# Patient Record
Sex: Male | Born: 1960 | Race: White | Hispanic: No | Smoking: Current every day smoker
Health system: Southern US, Community
[De-identification: ages and names within clinical notes are randomized; demographics above are authoritative.]

## PROBLEM LIST (undated history)

## (undated) DIAGNOSIS — K625 Hemorrhage of anus and rectum: Secondary | ICD-10-CM

## (undated) HISTORY — PX: TONSILLECTOMY: SUR1361

---

## 2014-09-28 DEATH — deceased

## 2015-01-05 ENCOUNTER — Encounter
Admission: RE | Disposition: A | Discharge status: Home / Self Care | Payer: Self-pay | Source: Ambulatory Visit | Attending: Gastroenterology

## 2015-01-05 ENCOUNTER — Ambulatory Visit: Payer: No Typology Code available for payment source | Admitting: Anesthesiology

## 2015-01-05 ENCOUNTER — Ambulatory Visit
Admission: RE | Admit: 2015-01-05 | Discharge: 2015-01-05 | Disposition: A | Discharge status: Home / Self Care | Payer: No Typology Code available for payment source | Source: Ambulatory Visit | Attending: Gastroenterology | Admitting: Gastroenterology

## 2015-01-05 ENCOUNTER — Encounter: Payer: Self-pay | Admitting: *Deleted

## 2015-01-05 DIAGNOSIS — K219 Gastro-esophageal reflux disease without esophagitis: Secondary | ICD-10-CM | POA: Diagnosis not present

## 2015-01-05 DIAGNOSIS — K449 Diaphragmatic hernia without obstruction or gangrene: Secondary | ICD-10-CM | POA: Diagnosis not present

## 2015-01-05 DIAGNOSIS — K640 First degree hemorrhoids: Secondary | ICD-10-CM | POA: Diagnosis not present

## 2015-01-05 DIAGNOSIS — K5730 Diverticulosis of large intestine without perforation or abscess without bleeding: Secondary | ICD-10-CM | POA: Insufficient documentation

## 2015-01-05 DIAGNOSIS — D122 Benign neoplasm of ascending colon: Secondary | ICD-10-CM | POA: Diagnosis not present

## 2015-01-05 DIAGNOSIS — K2270 Barrett's esophagus without dysplasia: Secondary | ICD-10-CM | POA: Diagnosis not present

## 2015-01-05 DIAGNOSIS — Z79899 Other long term (current) drug therapy: Secondary | ICD-10-CM | POA: Diagnosis not present

## 2015-01-05 DIAGNOSIS — K921 Melena: Secondary | ICD-10-CM | POA: Diagnosis present

## 2015-01-05 HISTORY — DX: Hemorrhage of anus and rectum: K62.5

## 2015-01-05 HISTORY — PX: ESOPHAGOGASTRODUODENOSCOPY: SHX5428

## 2015-01-05 HISTORY — PX: COLONOSCOPY: SHX5424

## 2015-01-05 SURGERY — COLONOSCOPY
Anesthesia: General

## 2015-01-05 MED ORDER — FENTANYL CITRATE (PF) 100 MCG/2ML IJ SOLN
INTRAMUSCULAR | Status: DC | PRN
  Administered 2015-01-05 (×3): 50 ug via INTRAVENOUS

## 2015-01-05 MED ORDER — PROPOFOL INFUSION 10 MG/ML OPTIME
INTRAVENOUS | Status: DC | PRN
  Administered 2015-01-05: 100 ug/kg/min via INTRAVENOUS

## 2015-01-05 MED ORDER — SODIUM CHLORIDE 0.9 % IV SOLN
INTRAVENOUS | Status: DC
  Administered 2015-01-05: 12:00:00 via INTRAVENOUS
  Administered 2015-01-05: 1000 mL via INTRAVENOUS

## 2015-01-05 MED ORDER — SODIUM CHLORIDE 0.9 % IV SOLN: INTRAVENOUS | Status: DC

## 2015-01-05 MED ORDER — PROPOFOL 10 MG/ML IV BOLUS
INTRAVENOUS | Status: DC | PRN
  Administered 2015-01-05: 30 mg via INTRAVENOUS

## 2015-01-05 MED ORDER — MIDAZOLAM HCL 2 MG/2ML IJ SOLN
INTRAMUSCULAR | Status: DC | PRN
  Administered 2015-01-05: 1 mg via INTRAVENOUS

## 2015-01-05 NOTE — Anesthesia Preprocedure Evaluation (Signed)
Anesthesia Evaluation  Patient identified by MRN, date of birth, ID band Patient awake    Reviewed: Allergy & Precautions, H&P , NPO status , Patient's Chart, lab work & pertinent test results, reviewed documented beta blocker date and time   Airway Mallampati: III  TM Distance: >3 FB Neck ROM: full    Dental no notable dental hx.    Pulmonary neg pulmonary ROS,  breath sounds clear to auscultation  Pulmonary exam normal       Cardiovascular Exercise Tolerance: Good negative cardio ROS  Rhythm:regular Rate:Normal     Neuro/Psych negative neurological ROS  negative psych ROS   GI/Hepatic negative GI ROS, Neg liver ROS,   Endo/Other  negative endocrine ROS  Renal/GU negative Renal ROS  negative genitourinary   Musculoskeletal   Abdominal   Peds  Hematology negative hematology ROS (+)   Anesthesia Other Findings   Reproductive/Obstetrics negative OB ROS                             Anesthesia Physical Anesthesia Plan  ASA: II  Anesthesia Plan: General   Post-op Pain Management:    Induction:   Airway Management Planned:   Additional Equipment:   Intra-op Plan:   Post-operative Plan:   Informed Consent: I have reviewed the patients History and Physical, chart, labs and discussed the procedure including the risks, benefits and alternatives for the proposed anesthesia with the patient or authorized representative who has indicated his/her understanding and acceptance.   Dental Advisory Given  Plan Discussed with: CRNA  Anesthesia Plan Comments:         Anesthesia Quick Evaluation

## 2015-01-05 NOTE — Transfer of Care (Signed)
Immediate Anesthesia Transfer of Care Note  Patient: Bob Brooks  Procedure(s) Performed: Procedure(s): COLONOSCOPY (N/A) ESOPHAGOGASTRODUODENOSCOPY (EGD) (N/A)  Patient Location: PACU  Anesthesia Type:General  Level of Consciousness: awake, alert  and oriented  Airway & Oxygen Therapy: Patient Spontanous Breathing  Post-op Assessment: Report given to RN  Post vital signs: Reviewed and stable  Last Vitals:  Filed Vitals:   01/05/15 1029  BP: 152/79  Pulse: 88  Temp: 37.1 C  Resp: 20    Complications: No apparent anesthesia complications

## 2015-01-05 NOTE — Op Note (Signed)
[facility] Patient Name: Bob Brooks Procedure Date: 01/05/2015 12:01 PM MRN: [id number] Account #: [id number] Date of Birth: [birth year] Admit Type: Outpatient Age: 54 Room: [facility] ENDO ROOM 4 Gender: Male Note Status: Finalized Procedure:         Colonoscopy Indications:       Hematochezia Providers:         Lucilla Lame, MD Referring MD:      Lucilla Lame, MD (Referring MD) Medicines:         Propofol per Anesthesia Complications:     No immediate complications. Procedure:         Pre-Anesthesia Assessment:                    - Prior to the procedure, a History and Physical was                     performed, and patient medications and allergies were                     reviewed. The patient's tolerance of previous anesthesia                     was also reviewed. The risks and benefits of the procedure                     and the sedation options and risks were discussed with the                     patient. All questions were answered, and informed consent                     was obtained. Prior Anticoagulants: The patient has taken                     no previous anticoagulant or antiplatelet agents. ASA                     Grade Assessment: II - A patient with mild systemic                     disease. After reviewing the risks and benefits, the                     patient was deemed in satisfactory condition to undergo                     the procedure.                    After obtaining informed consent, the colonoscope was                     passed under direct vision. Throughout the procedure, the                     patient's blood pressure, pulse, and oxygen saturations                     were monitored continuously. The Olympus CF-Q160AL                     colonoscope (S#. [id number]) was introduced through the anus                     and  advanced to the the cecum, identified by appendiceal                     orifice and ileocecal  valve. The colonoscopy was performed                     without difficulty. The patient tolerated the procedure                     well. The quality of the bowel preparation was good. Findings:      The perianal and digital rectal examinations were normal.      A 6 mm polyp was found in the ascending colon. The polyp was sessile.       The polyp was removed with a cold snare. Resection and retrieval were       complete.      A few small-mouthed diverticula were found in the entire colon.      Non-bleeding internal hemorrhoids were found during retroflexion. The       hemorrhoids were Grade I (internal hemorrhoids that do not prolapse). Impression:        - One 6 mm polyp in the ascending colon. Resected and                     retrieved.                    - Diverticulosis in the entire examined colon.                    - Non-bleeding internal hemorrhoids. Recommendation:    - Await pathology results.                    - Repeat colonoscopy in 5 years if polyp adenoma and 10                     years if hyperplastic Procedure Code(s): --- Professional ---                    [phone], Colonoscopy, flexible; with removal of tumor(s),                     polyp(s), or other lesion(s) by snare technique Diagnosis Code(s): --- Professional ---                    K92.1, Melena                    D12.2, Benign neoplasm of ascending colon CPT copyright 2014 American Medical Association. All rights reserved. The codes documented in this report are preliminary and upon coder review may  be revised to meet current compliance requirements. Lucilla Lame, MD 01/05/2015 12:15:54 PM This report has been signed electronically. Number of Addenda: 0 Note Initiated On: 01/05/2015 12:01 PM Scope Withdrawal Time: 0 hours 7 minutes 13 seconds  Total Procedure Duration: 0 hours 9 minutes 43 seconds       [facility]

## 2015-01-05 NOTE — Op Note (Signed)
[facility] Gastroenterology Patient Name: Bob Brooks Procedure Date: 01/05/2015 11:48 AM MRN: [id number] Account #: [id number] Date of Birth: October 23, 1960 Admit Type: Outpatient Age: 54 Room: [facility] ENDO ROOM 4 Gender: Male Note Status: Finalized Procedure:         Upper GI endoscopy Indications:       Heartburn Providers:         Lucilla Lame, MD Referring MD:      Lucilla Lame, MD (Referring MD) Medicines:         Propofol per Anesthesia Complications:     No immediate complications. Procedure:         Pre-Anesthesia Assessment:                    - Prior to the procedure, a History and Physical was                     performed, and patient medications and allergies were                     reviewed. The patient's tolerance of previous anesthesia                     was also reviewed. The risks and benefits of the procedure                     and the sedation options and risks were discussed with the                     patient. All questions were answered, and informed consent                     was obtained. Prior Anticoagulants: The patient has taken                     no previous anticoagulant or antiplatelet agents. ASA                     Grade Assessment: II - A patient with mild systemic                     disease. After reviewing the risks and benefits, the                     patient was deemed in satisfactory condition to undergo                     the procedure.                    After obtaining informed consent, the endoscope was passed                     under direct vision. Throughout the procedure, the                     patient's blood pressure, pulse, and oxygen saturations                     were monitored continuously. The Endoscope was introduced                     through the mouth, and advanced to the second part of  duodenum. The upper GI endoscopy was accomplished without                     difficulty. The  patient tolerated the procedure well. Findings:      A medium-sized hiatus hernia was present.      The Z-line was irregular and was found in the lower third of the       esophagus. Biopsies were taken with a cold forceps for histology.      The stomach was normal.      The examined duodenum was normal. Impression:        - Medium-sized hiatus hernia.                    - Z-line irregular, in the lower third of the esophagus.                     Biopsied.                    - Normal stomach.                    - Normal examined duodenum. Recommendation:    - Await pathology results.                    - Perform a colonoscopy today. Procedure Code(s): --- Professional ---                    [phone], Esophagogastroduodenoscopy, flexible, transoral;                     with biopsy, single or multiple Diagnosis Code(s): --- Professional ---                    R12, Heartburn                    K44.9, Diaphragmatic hernia without obstruction or gangrene                    K22.8, Other specified diseases of esophagus CPT copyright 2014 American Medical Association. All rights reserved. The codes documented in this report are preliminary and upon coder review may  be revised to meet current compliance requirements. Lucilla Lame, MD 01/05/2015 12:01:22 PM This report has been signed electronically. Number of Addenda: 0 Note Initiated On: 01/05/2015 11:48 AM      [facility]

## 2015-01-05 NOTE — Anesthesia Postprocedure Evaluation (Signed)
  Anesthesia Post-op Note  Patient: Bob Brooks  Procedure(s) Performed: Procedure(s): COLONOSCOPY (N/A) ESOPHAGOGASTRODUODENOSCOPY (EGD) (N/A)  Anesthesia type:General  Patient location: PACU  Post pain: Pain level controlled  Post assessment: Post-op Vital signs reviewed, Patient's Cardiovascular Status Stable, Respiratory Function Stable, Patent Airway and No signs of Nausea or vomiting  Post vital signs: Reviewed and stable  Last Vitals:  Filed Vitals:   01/05/15 1240  BP: 173/99  Pulse:   Temp:   Resp: 18    Level of consciousness: awake, alert  and patient cooperative  Complications: No apparent anesthesia complications

## 2015-01-05 NOTE — H&P (Signed)
  [facility]  [address], [place], [place] [address] Phone: [phone] Fax : [phone]  Primary Care Physician:  No PCP Per Patient Primary Gastroenterologist:  Dr. Allen Norris  Pre-Procedure History & Physical: HPI:  Bob Brooks is a 54 y.o. male is here for an endoscopy and colonoscopy.   Past Medical History  Diagnosis Date  . Rectal bleeding     Past Surgical History  Procedure Laterality Date  . Tonsillectomy      Prior to Admission medications   Medication Sig Start Date End Date Taking? Authorizing Provider  omeprazole (PRILOSEC) 20 MG capsule Take 1 capsule by mouth daily. 12/11/14  Yes Historical Provider, MD  [place] Take 1 Container by mouth once. 11/12/14  Yes Historical Provider, MD    Allergies as of 12/17/2014  . (Not on File)    No family history on file.  History   Social History  . Marital Status: Divorced    Spouse Name: N/A  . Number of Children: N/A  . Years of Education: N/A   Occupational History  . Not on file.   Social History Main Topics  . Smoking status: Not on file  . Smokeless tobacco: Not on file  . Alcohol Use: Not on file  . Drug Use: Not on file  . Sexual Activity: Not on file   Other Topics Concern  . Not on file   Social History Narrative  . No narrative on file    Review of Systems: See HPI, otherwise negative ROS  Physical Exam: BP 152/79 mmHg  Pulse 88  Temp(Src) 98.7 F (37.1 C) (Oral)  Resp 20  Ht 6' (1.829 m)  Wt 210 lb (95.255 kg)  BMI 28.47 kg/m2  SpO2 100% General:   Alert,  pleasant and cooperative in NAD Head:  Normocephalic and atraumatic. Neck:  Supple; no masses or thyromegaly. Lungs:  Clear throughout to auscultation.    Heart:  Regular rate and rhythm. Abdomen:  Soft, nontender and nondistended. Normal bowel sounds, without guarding, and without rebound.   Neurologic:  Alert and  oriented x4;  grossly normal neurologically.  Impression/Plan: [name] is here for an endoscopy and colonoscopy to be performed for hematochezia and GERD  Risks, benefits, limitations, and alternatives regarding  endoscopy and colonoscopy have been reviewed with the patient.  Questions have been answered.  All parties agreeable.   [facility], MD  01/05/2015, 10:54 AM

## 2015-01-06 ENCOUNTER — Encounter: Payer: Self-pay | Admitting: Gastroenterology

## 2015-01-06 LAB — SURGICAL PATHOLOGY

## 2016-07-24 ENCOUNTER — Ambulatory Visit: Payer: No Typology Code available for payment source | Admitting: Podiatry

## 2016-09-01 ENCOUNTER — Ambulatory Visit (INDEPENDENT_AMBULATORY_CARE_PROVIDER_SITE_OTHER): Payer: No Typology Code available for payment source

## 2016-09-01 ENCOUNTER — Ambulatory Visit (INDEPENDENT_AMBULATORY_CARE_PROVIDER_SITE_OTHER): Payer: No Typology Code available for payment source | Admitting: Podiatry

## 2016-09-01 ENCOUNTER — Encounter: Payer: Self-pay | Admitting: Podiatry

## 2016-09-01 VITALS — Temp 98.5°F | Resp 16

## 2016-09-01 DIAGNOSIS — E1143 Type 2 diabetes mellitus with diabetic autonomic (poly)neuropathy: Secondary | ICD-10-CM | POA: Diagnosis not present

## 2016-09-01 DIAGNOSIS — E1149 Type 2 diabetes mellitus with other diabetic neurological complication: Secondary | ICD-10-CM | POA: Diagnosis not present

## 2016-09-01 DIAGNOSIS — R52 Pain, unspecified: Secondary | ICD-10-CM

## 2016-09-01 MED ORDER — NONFORMULARY OR COMPOUNDED ITEM: 1.0000 g | Freq: Four times a day (QID) | 2 refills | Status: AC

## 2016-09-01 NOTE — Progress Notes (Signed)
   Subjective:    Patient ID: Bob Brooks, male    DOB: 1960/11/14, 56 y.o.   MRN: [id number]  HPI    Review of Systems  All other systems reviewed and are negative.      Objective:   Physical Exam        Assessment & Plan:

## 2016-09-01 NOTE — Progress Notes (Signed)
Patient ID: Bob Brooks, male   DOB: July 05, 1961, 56 y.o.   MRN: [id number] Subjective:  Patient presents today for lower showing numbness and tingling which is been going on for several months now. Patient states that he is a diabetic. Patient states that the incision on the bottom of his feet and toes of his right lower extremity is constant. There are no alleviating factors. Patient works on concrete floors and states that after several hours his feet become incredibly painful. Patient describes the pain as "coldness".  Patient also states that he is just recently been diagnosed with diabetes. Patient had vision symptoms which caused him to go to his primary care physician when he was diagnosed. Patient states that he almost what Patient presents today for further treatment and evaluation    Objective/Physical Exam General: The patient is alert and oriented x3 in no acute distress.  Dermatology: Skin is warm, dry and supple bilateral lower extremities. Negative for open lesions or macerations.  Vascular: Palpable pedal pulses bilaterally. No edema or erythema noted. Capillary refill within normal limits.  Neurological: Epicritic and protective threshold diminished bilaterally.   Musculoskeletal Exam: Range of motion within normal limits to all pedal and ankle joints bilateral. Muscle strength 5/5 in all groups bilateral.    Assessment: #1 diabetes mellitus with manifestations of peripheral neuropathy   Plan of Care:  #1 Patient was evaluated. #2 patient is currently taking gabapentin 100 mg TID prescribed by his primary care physician. Recommend increasing the dose to 300 mg TID.  #3 prescription for peripheral neuropathy pain cream dispensed through [facility] #4 return to clinic when necessary   Edrick Kins, DPM [facility]  Dr. Edrick Kins, [address]                                        [place], [place] [address]                Office  [phone]  Fax [phone]

## 2018-05-14 ENCOUNTER — Ambulatory Visit
Admission: RE | Admit: 2018-05-14 | Discharge: 2018-05-14 | Disposition: A | Discharge status: Home / Self Care | Payer: Self-pay | Source: Ambulatory Visit | Attending: Nurse Practitioner | Admitting: Nurse Practitioner

## 2018-05-14 ENCOUNTER — Other Ambulatory Visit: Payer: Self-pay | Admitting: Nurse Practitioner

## 2018-05-14 DIAGNOSIS — M79642 Pain in left hand: Secondary | ICD-10-CM

## 2018-05-14 DIAGNOSIS — X58XXXA Exposure to other specified factors, initial encounter: Secondary | ICD-10-CM | POA: Insufficient documentation

## 2018-05-14 DIAGNOSIS — Z9181 History of falling: Secondary | ICD-10-CM | POA: Insufficient documentation

## 2018-05-14 DIAGNOSIS — S62112A Displaced fracture of triquetrum [cuneiform] bone, left wrist, initial encounter for closed fracture: Secondary | ICD-10-CM | POA: Insufficient documentation

## 2020-04-17 IMAGING — CR DG HAND COMPLETE 3+V*L*
3 series · 3 of 3 positions shown · non-contrast
Comparison: None.

CLINICAL DATA: Left hand pain.  Fall.

EXAM:
LEFT HAND - COMPLETE 3+ VIEW

[hand ap]
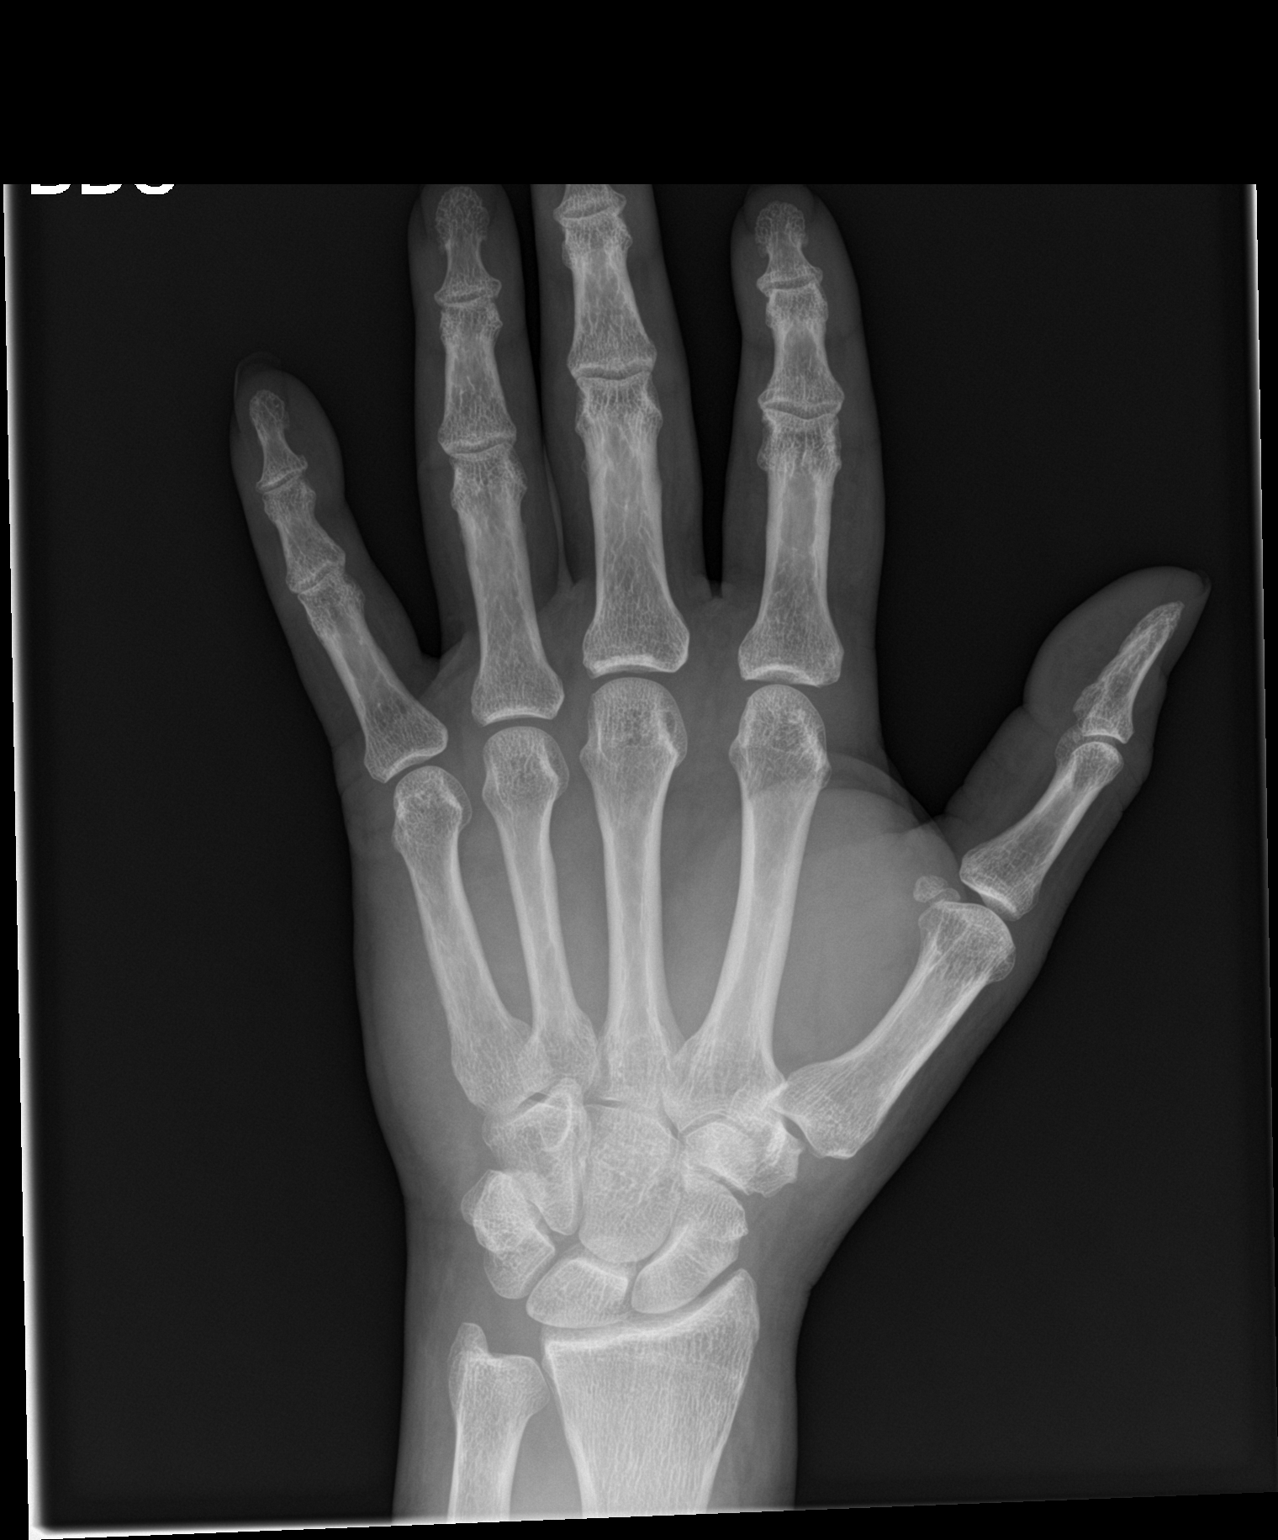

[hand obl]
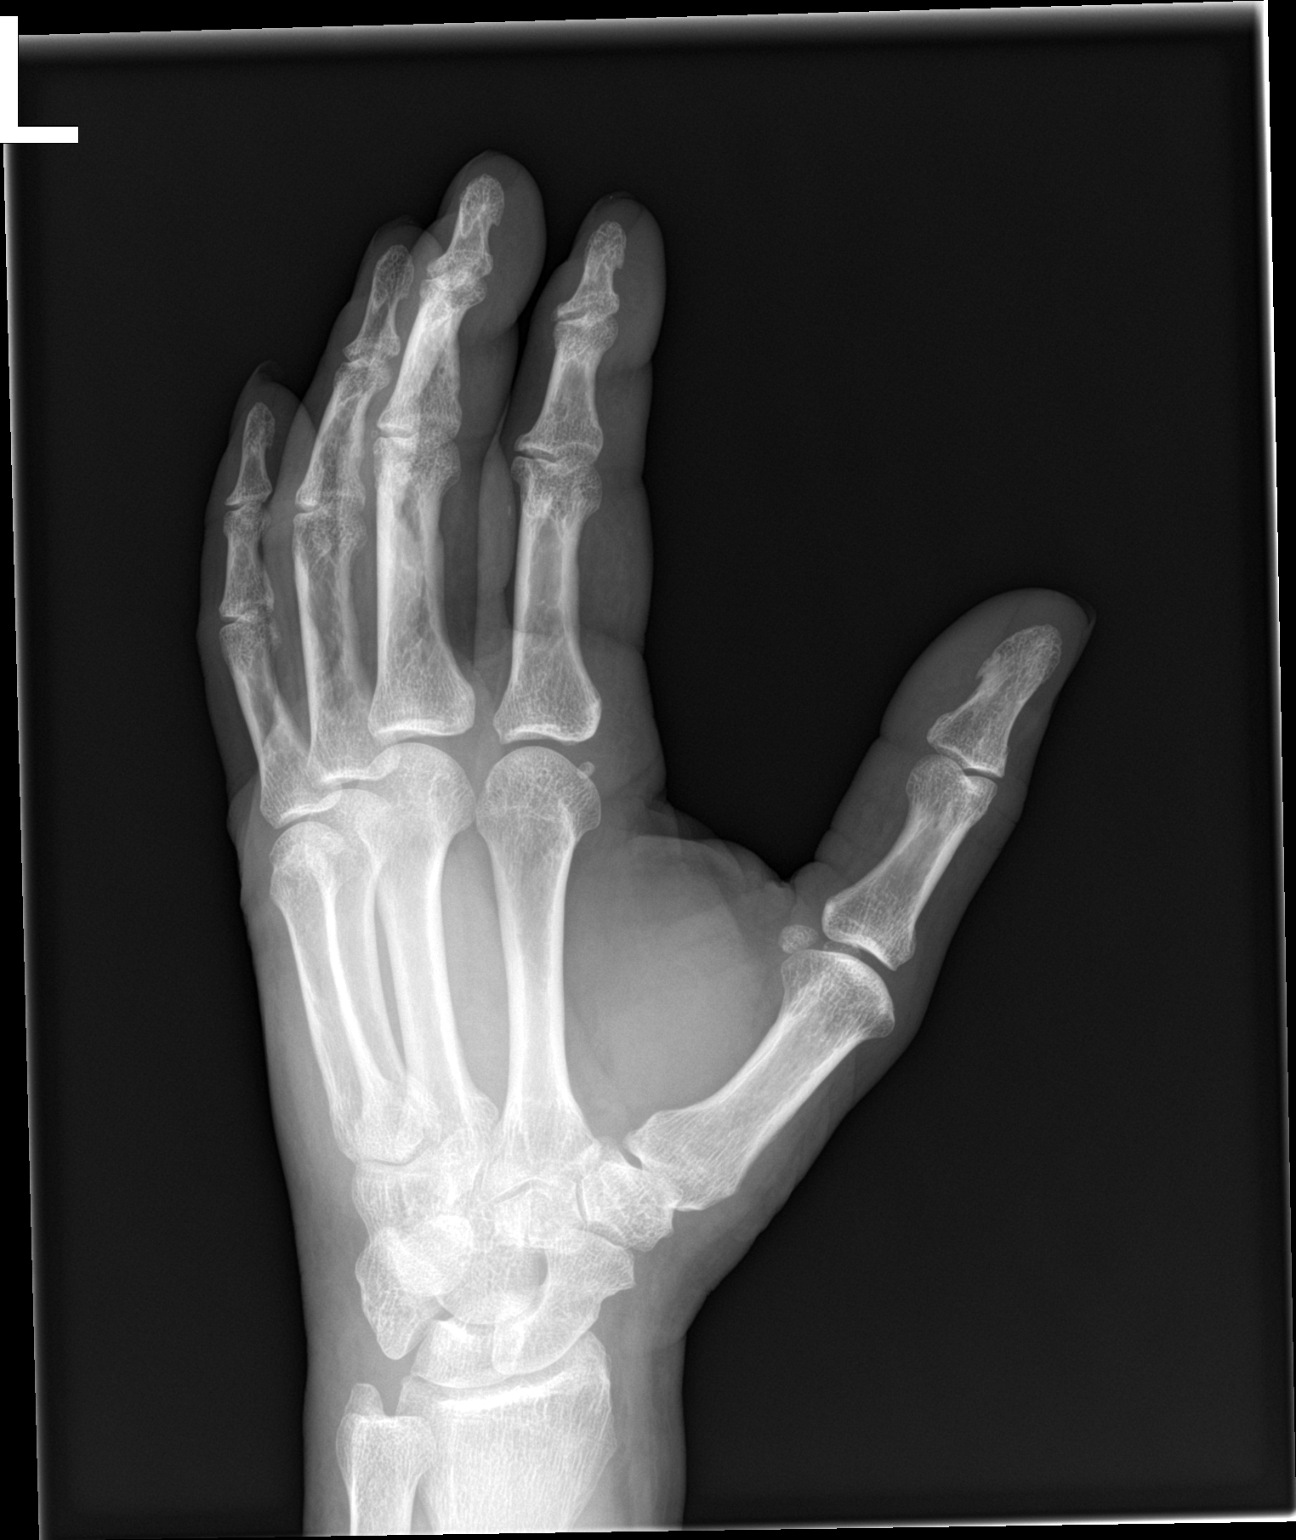

[hand lat]
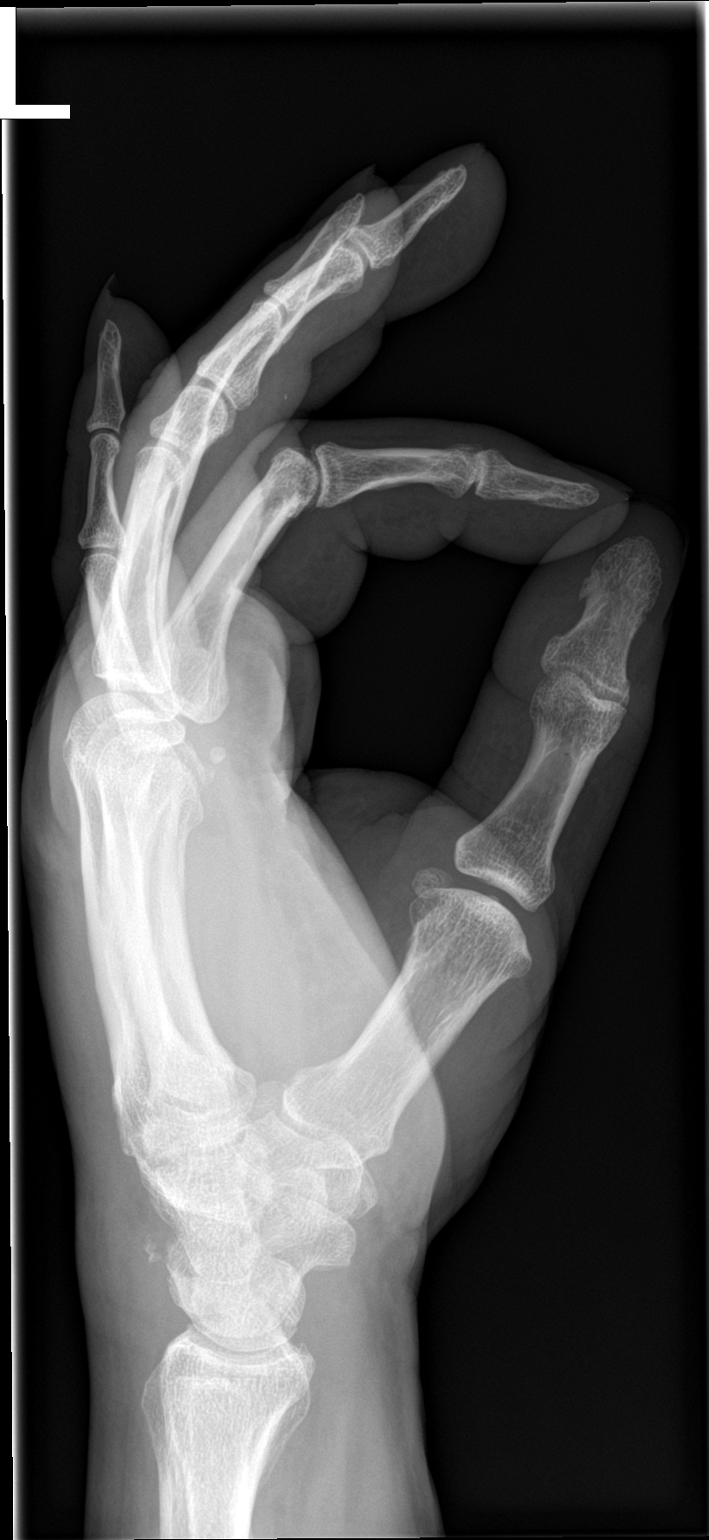

[3 of 3 positions shown; findings below may reference images not displayed]

FINDINGS: Posterior bone fragments noted within the wrist on the lateral view
compatible with triquetrum avulsion fracture. Overlying soft tissue
swelling. Joint spaces maintained. No subluxation or dislocation.
IMPRESSION: Triquetrum avulsion fracture noted posteriorly on the lateral view.
